# Patient Record
Sex: Male | Born: 1990 | Race: White | Hispanic: No | Marital: Single | Smoking: Current every day smoker
Health system: Southern US, Community
[De-identification: ages and names within clinical notes are randomized; demographics above are authoritative.]

## PROBLEM LIST (undated history)

## (undated) DIAGNOSIS — F909 Attention-deficit hyperactivity disorder, unspecified type: Secondary | ICD-10-CM

---
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
## 2011-03-19 ENCOUNTER — Inpatient Hospital Stay (INDEPENDENT_AMBULATORY_CARE_PROVIDER_SITE_OTHER)
Admission: RE | Admit: 2011-03-19 | Discharge: 2011-03-19 | Disposition: A | Discharge status: Home / Self Care | Payer: Medicaid Other | Source: Ambulatory Visit | Attending: Family Medicine | Admitting: Family Medicine

## 2011-03-19 ENCOUNTER — Ambulatory Visit (INDEPENDENT_AMBULATORY_CARE_PROVIDER_SITE_OTHER): Payer: Medicaid Other

## 2011-03-19 DIAGNOSIS — S60229A Contusion of unspecified hand, initial encounter: Secondary | ICD-10-CM

## 2012-01-28 ENCOUNTER — Emergency Department (INDEPENDENT_AMBULATORY_CARE_PROVIDER_SITE_OTHER)
Admission: EM | Admit: 2012-01-28 | Discharge: 2012-01-28 | Disposition: A | Discharge status: Home / Self Care | Payer: Self-pay | Source: Home / Self Care | Attending: Emergency Medicine | Admitting: Emergency Medicine

## 2012-01-28 ENCOUNTER — Emergency Department (INDEPENDENT_AMBULATORY_CARE_PROVIDER_SITE_OTHER): Payer: Medicaid Other

## 2012-01-28 ENCOUNTER — Encounter (HOSPITAL_COMMUNITY): Payer: Self-pay | Admitting: Emergency Medicine

## 2012-01-28 DIAGNOSIS — J45909 Unspecified asthma, uncomplicated: Secondary | ICD-10-CM

## 2012-01-28 DIAGNOSIS — R0989 Other specified symptoms and signs involving the circulatory and respiratory systems: Secondary | ICD-10-CM

## 2012-01-28 DIAGNOSIS — F411 Generalized anxiety disorder: Secondary | ICD-10-CM

## 2012-01-28 DIAGNOSIS — F458 Other somatoform disorders: Secondary | ICD-10-CM

## 2012-01-28 DIAGNOSIS — F419 Anxiety disorder, unspecified: Secondary | ICD-10-CM

## 2012-01-28 DIAGNOSIS — Z720 Tobacco use: Secondary | ICD-10-CM

## 2012-01-28 HISTORY — DX: Attention-deficit hyperactivity disorder, unspecified type: F90.9

## 2012-01-28 LAB — POCT I-STAT, CHEM 8
Chloride: 102 mEq/L (ref 96–112)
Glucose, Bld: 97 mg/dL (ref 70–99)
HCT: 48 % (ref 39.0–52.0)
Potassium: 3.4 mEq/L — ABNORMAL LOW (ref 3.5–5.1)

## 2012-01-28 MED ORDER — OMEPRAZOLE 20 MG PO CPDR: 20.0000 mg | DELAYED_RELEASE_CAPSULE | Freq: Two times a day (BID) | ORAL | Status: DC

## 2012-01-28 MED ORDER — BUSPIRONE HCL 15 MG PO TABS: 7.5000 mg | ORAL_TABLET | Freq: Three times a day (TID) | ORAL | Status: AC

## 2012-01-28 MED ORDER — ALBUTEROL SULFATE HFA 108 (90 BASE) MCG/ACT IN AERS: 1.0000 | INHALATION_SPRAY | Freq: Four times a day (QID) | RESPIRATORY_TRACT | Status: DC | PRN

## 2012-01-28 NOTE — ED Provider Notes (Signed)
Chief Complaint  Patient presents with  . Shortness of Breath    History of Present Illness:   Jake Mcdonald is a 21 year old male who comes in today with a number of problems including a sensation of a lump in his throat, difficulty breathing, fatigue, cough, and chest pain. The past 2 weeks he notes a sensation of a lump in his throat. He feels like something stuck in his throat. He denies any dysphagia, but notes a decreased appetite due to stress brought on by recent death of his father, difficulty finding a job, and a difficult living situation. He has had some heartburn and indigestion. He denies any pain on swallowing or odynophagia. Food has not gotten stuck in his throat and these had no nausea or vomiting. He also has trouble breathing. He feels like he has trouble getting air in and out. He feels short of breath and sometimes has wheezing. He has a history of asthma in the past and has used an inhaler. His shortness of breath occurs while he is at rest and is not any worse with exertion. He denies any nighttime symptoms. He smokes about half pack of cigarettes per day. He also uses chewing tobacco as well. He feels weak, tired, rundown, and doesn't have much energy. He also feels chilled and sometimes he feels shaky and nervous. He has not had a fever or sore throat. He does have a cough productive of small amount of yellow sputum. He also notes a two-day history of chest pain in the subxiphoid area which radiates through to the back. This tends to be brought on by stress and comes and goes. It's not related to position, exertion, activity, or meals. He denies any associated diaphoresis although he does have nausea, not necessarily associated with the chest pain. He feels nervous and anxious. He denies any depression or suicidal ideation. He has a history of sciatica for the past 4-5 years. He also has a history of ADD but is not taking any medication for that right now.   Review of Systems:  Other than  noted above, the patient denies any of the following symptoms. Systemic:  No fever, chills, sweats, or fatigue. ENT:  No nasal congestion, rhinorrhea, or sore throat. Pulmonary:  No cough, wheezing, shortness of breath, sputum production, hemoptysis. Cardiac:  No palpitations, rapid heartbeat, dizziness, presyncope or syncope. GI:  No abdominal pain, heartburn, nausea, or vomiting. Skin:  No rash or itching. Ext:  No leg pain or swelling.   PMFSH:  Past medical history, family history, social history, meds, and allergies were reviewed and updated as needed.  Physical Exam:   Vital signs:  BP 120/76  Pulse 53  Temp 99.2 F (37.3 C) (Oral)  Resp 18  SpO2 100% Gen:  Alert, oriented, in no distress, skin warm and dry. Eye:  PERRL, lids and conjunctivas normal.  Sclera non-icteric. ENT:  Mucous membranes moist, pharynx clear. Neck:  Supple, no adenopathy or mass.  No JVD.he did have some tenderness to palpation just above the sternal notch.  Lungs:  Clear to auscultation, no wheezes, rales or rhonchi.  No respiratory distress. Heart:  Regular rhythm.  No gallops, murmers, clicks or rubs. Chest:  No chest wall tenderness. Abdomen:  Soft,  Slight epigastric tenderness to palpation without guarding or rebound , no organomegaly or mass.  Bowel sounds normal.  No pulsatile abdominal mass or bruit. Ext:  No edema.  No calf tenderness and Homann's sign negative.  Pulses full and equal. Skin:  Warm and dry.  No rash.    Labs:   Results for orders placed during the hospital encounter of 01/28/12  POCT I-STAT, CHEM 8      Component Value Range   Sodium 142  135 - 145 mEq/L   Potassium 3.4 (*) 3.5 - 5.1 mEq/L   Chloride 102  96 - 112 mEq/L   BUN 7  6 - 23 mg/dL   Creatinine, Ser 4.09  0.50 - 1.35 mg/dL   Glucose, Bld 97  70 - 99 mg/dL   Calcium, Ion 8.11  9.14 - 1.23 mmol/L   TCO2 25  0 - 100 mmol/L   Hemoglobin 16.3  13.0 - 17.0 g/dL   HCT 78.2  95.6 - 21.3 %     Radiology:  Dg Chest  2 View  01/28/2012  *RADIOLOGY REPORT*  Clinical Data: Chest pain  CHEST - 2 VIEW  Comparison: None.  Findings: There is no focal infiltrate, pulmonary edema, or pleural effusion.  The mediastinal contour and cardiac silhouette are normal.  There is scoliosis of spine.  IMPRESSION: No acute cardiopulmonary disease identified.  No   Original Report Authenticated By: Sherian Rein, M.D.     EKG:   Date: 01/28/2012  Rate: 56   Rhythm: sinus bradycardia  QRS Axis: normal  Intervals: normal  ST/T Wave abnormalities: normal  Conduction Disutrbances:none  Narrative Interpretation: Sinus bradycardia with marked sinus arrhythmia, otherwise normal EKG.  Old EKG Reviewed: none available  Assessment:  The primary encounter diagnosis was Reflux esophagitis. Diagnoses of Globus sensation, Asthma, Anxiety, and Tobacco abuse were also pertinent to this visit.   I think that most of his symptoms are due to anxiety with a large component of reflux and tobacco abuse as well. I discussed with him the importance of finding a primary care physician, although he has no insurance right now and is not on Medicaid. I also spent a good deal of time discussing tobacco cessation with him. I offered to give him a prescription for Nicoderm patches, but he states he cannot afford this and will try to cut down for right now. I told him once he got medical insurance he should make a concerted effort to quit.  Plan:   1.  The following meds were prescribed:   New Prescriptions   ALBUTEROL (PROVENTIL HFA;VENTOLIN HFA) 108 (90 BASE) MCG/ACT INHALER    Inhale 1-2 puffs into the lungs every 6 (six) hours as needed for wheezing.   BUSPIRONE (BUSPAR) 15 MG TABLET    Take 0.5 tablets (7.5 mg total) by mouth 3 (three) times daily.   OMEPRAZOLE (PRILOSEC) 20 MG CAPSULE    Take 1 capsule (20 mg total) by mouth 2 (two) times daily before a meal.   2.  The patient was instructed in symptomatic care and handouts were given. 3.  The  patient was told to return if becoming worse in any way, if no better in 3 or 4 days, and given some red flag symptoms that would indicate earlier return.    Reuben Likes, MD 01/28/12 [phone]

## 2012-01-28 NOTE — ED Notes (Addendum)
C/o sob for 2 weeks, weakness, no fever, no sore throat.  Also c/o chest pain, touching center, epigastric area

## 2013-01-01 IMAGING — CR DG HAND COMPLETE 3+V*R*
3 series · 3 of 3 positions shown · non-contrast
Comparison: None.

CLINICAL DATA: Punching injury to the right hand.

RIGHT HAND - COMPLETE 3+ VIEW 03/19/2011:

[view not recorded (1 of 3)]
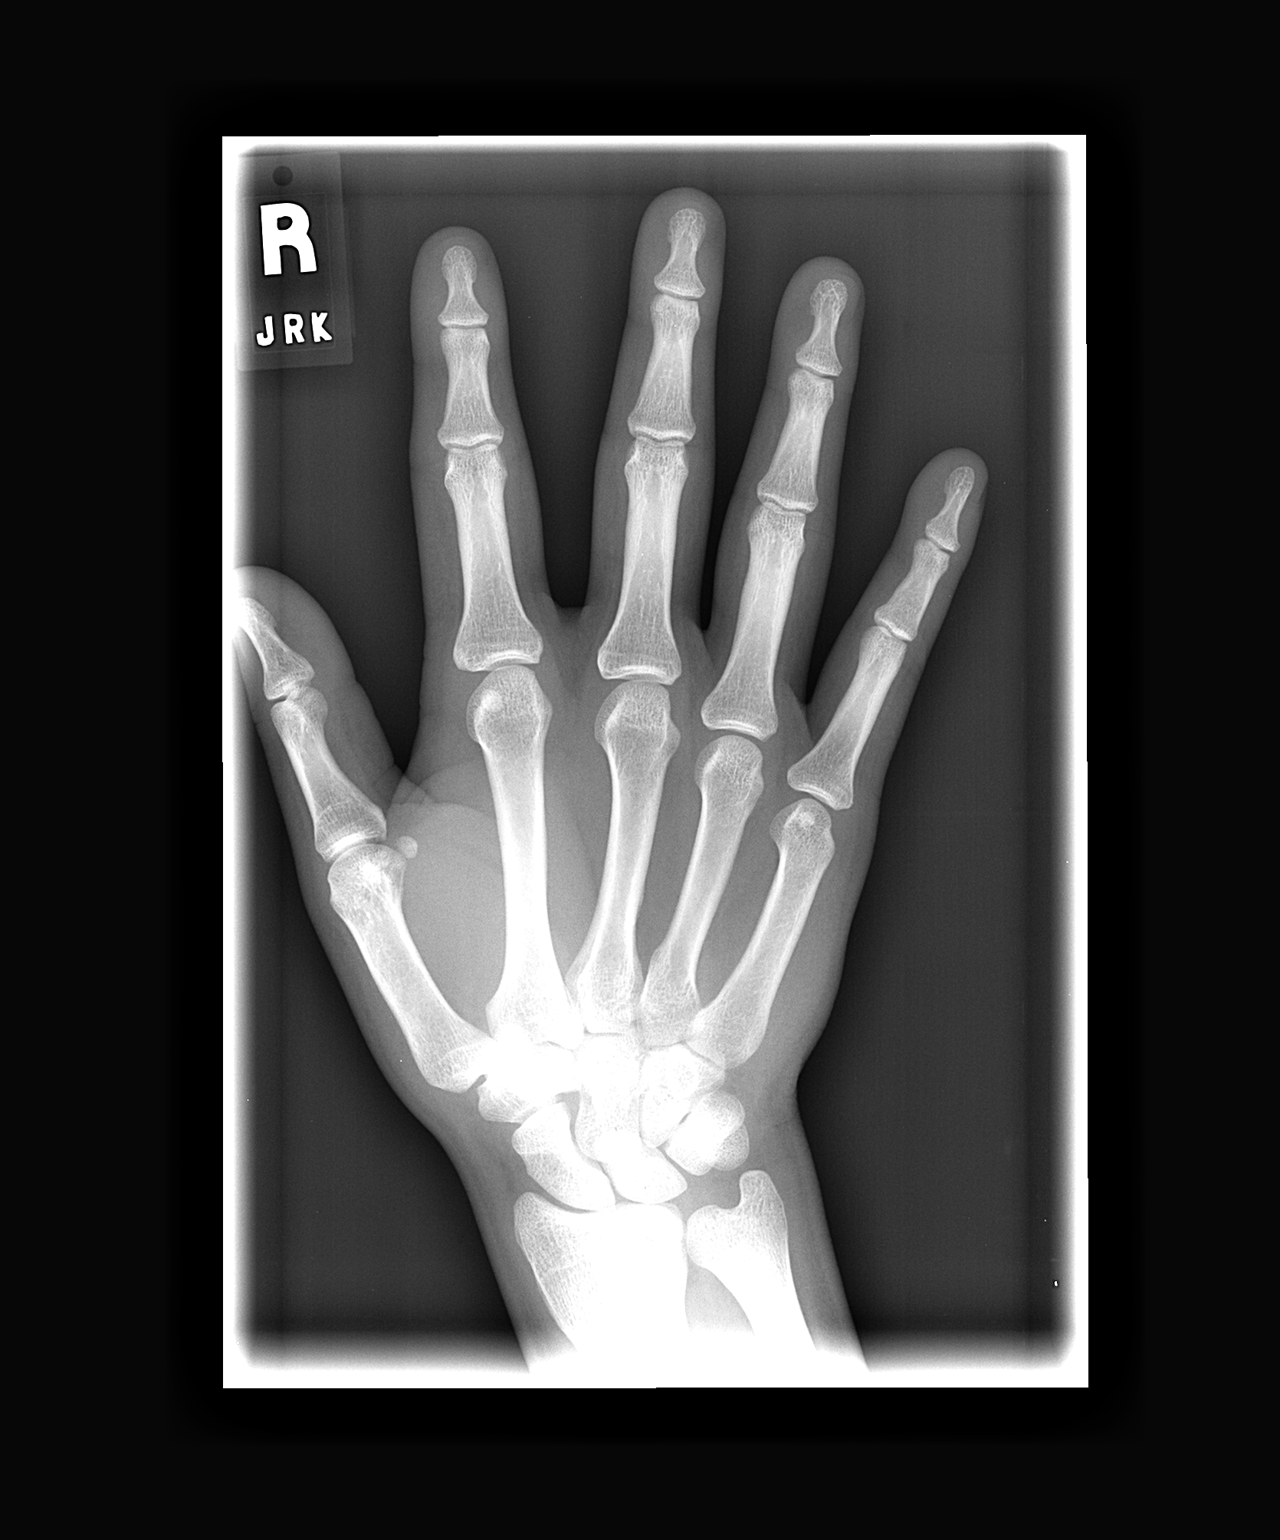

[view not recorded (2 of 3)]
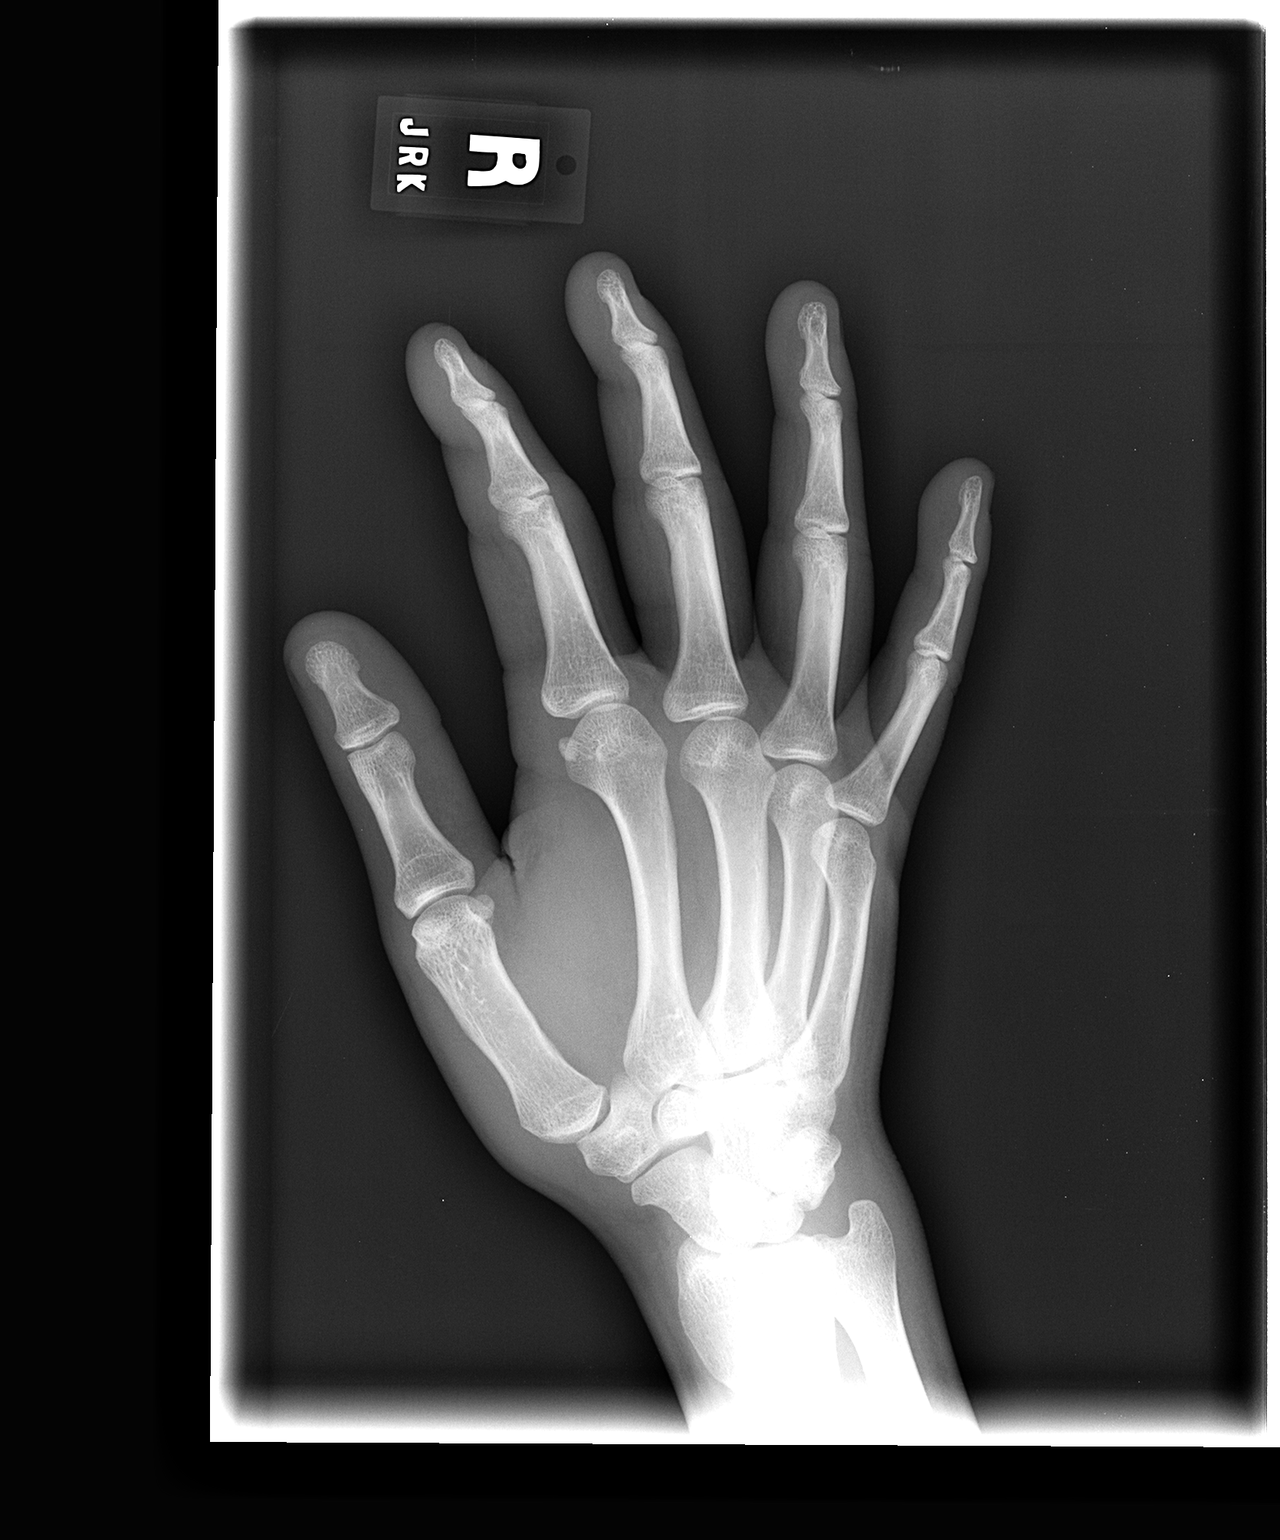

[view not recorded (3 of 3)]
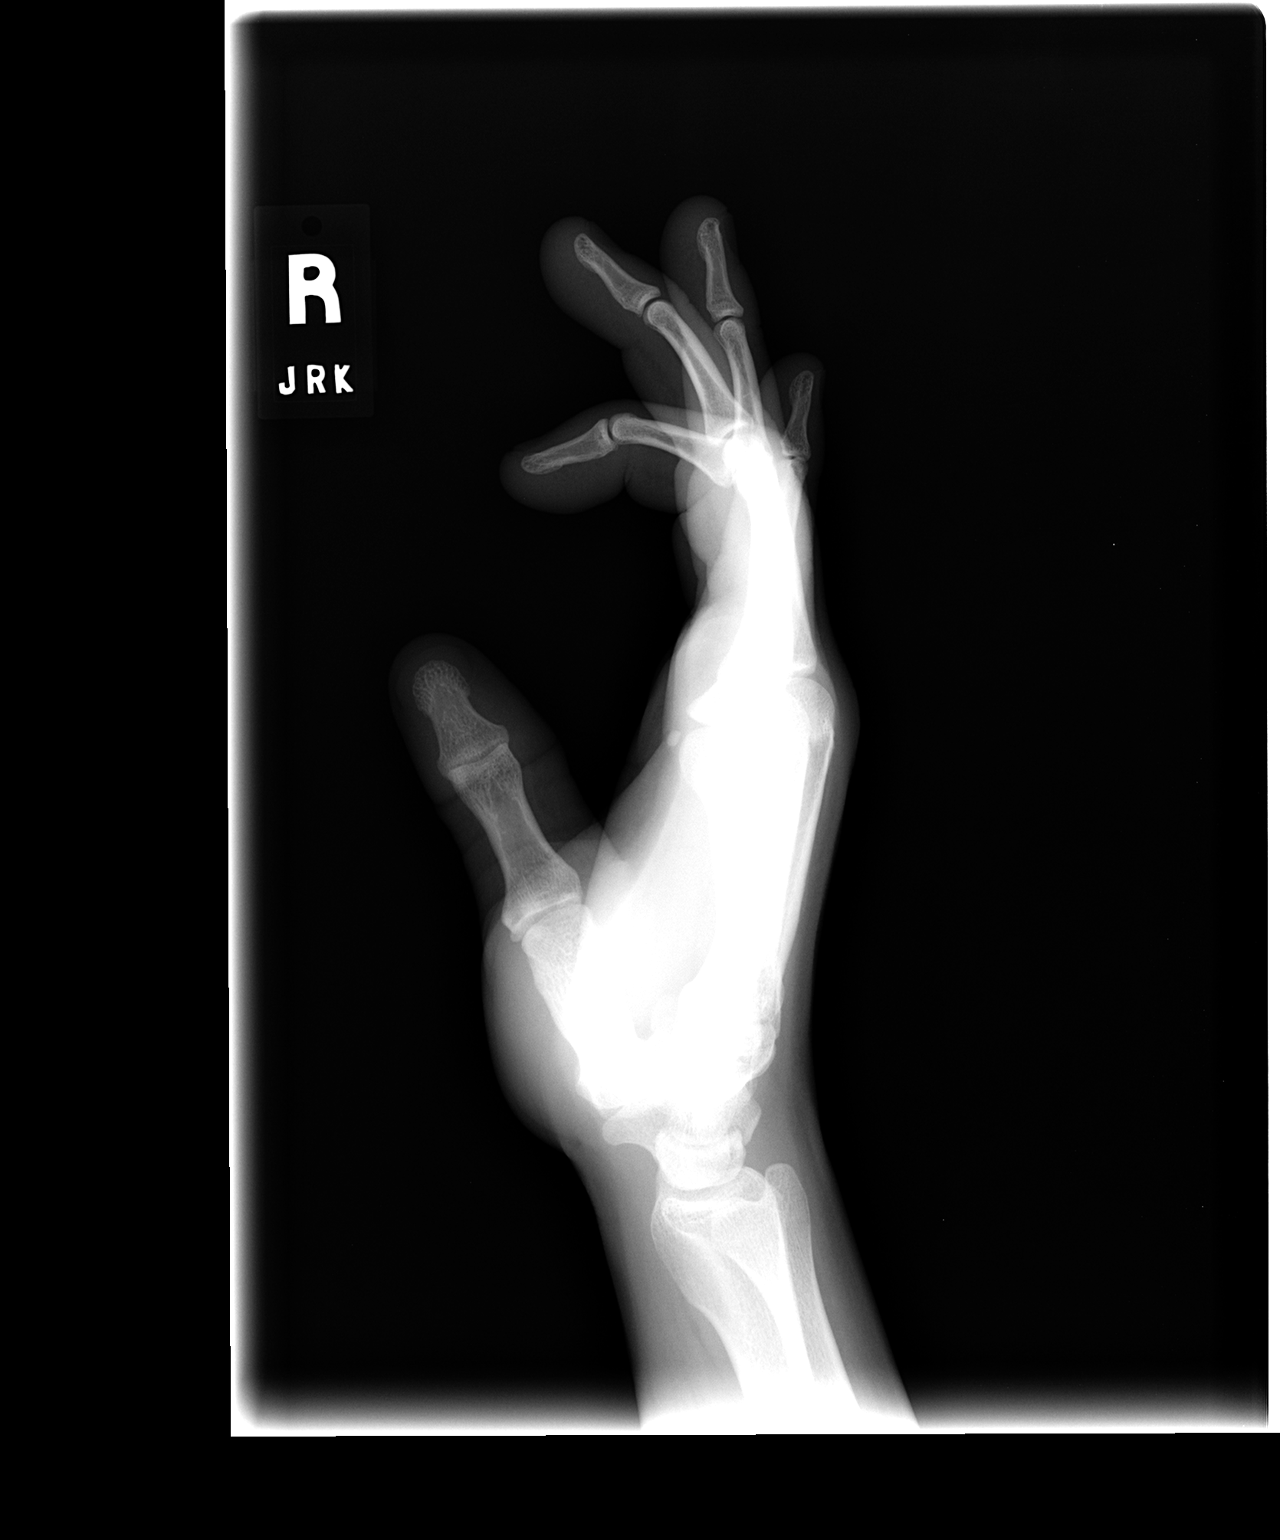

[3 of 3 positions shown; findings below may reference images not displayed]

FINDINGS: No evidence of acute or subacute fracture or dislocation.
Well-preserved joint spaces.  Well-preserved bone mineral density.
No intrinsic osseous abnormalities.
IMPRESSION: Normal examination.

## 2013-02-10 ENCOUNTER — Encounter (HOSPITAL_COMMUNITY): Payer: Self-pay | Admitting: Emergency Medicine

## 2013-02-10 ENCOUNTER — Emergency Department (HOSPITAL_COMMUNITY): Payer: Medicaid Other

## 2013-02-10 ENCOUNTER — Emergency Department (HOSPITAL_COMMUNITY)
Admission: EM | Admit: 2013-02-10 | Discharge: 2013-02-10 | Disposition: A | Discharge status: Home / Self Care | Payer: Medicaid Other | Attending: Emergency Medicine | Admitting: Emergency Medicine

## 2013-02-10 DIAGNOSIS — Y939 Activity, unspecified: Secondary | ICD-10-CM | POA: Insufficient documentation

## 2013-02-10 DIAGNOSIS — Z8659 Personal history of other mental and behavioral disorders: Secondary | ICD-10-CM | POA: Insufficient documentation

## 2013-02-10 DIAGNOSIS — F17200 Nicotine dependence, unspecified, uncomplicated: Secondary | ICD-10-CM | POA: Insufficient documentation

## 2013-02-10 DIAGNOSIS — X58XXXA Exposure to other specified factors, initial encounter: Secondary | ICD-10-CM | POA: Insufficient documentation

## 2013-02-10 DIAGNOSIS — S91309A Unspecified open wound, unspecified foot, initial encounter: Secondary | ICD-10-CM | POA: Insufficient documentation

## 2013-02-10 DIAGNOSIS — Y929 Unspecified place or not applicable: Secondary | ICD-10-CM | POA: Insufficient documentation

## 2013-02-10 DIAGNOSIS — S91331A Puncture wound without foreign body, right foot, initial encounter: Secondary | ICD-10-CM

## 2013-02-10 MED ORDER — HYDROCODONE-ACETAMINOPHEN 5-325 MG PO TABS
1.0000 | ORAL_TABLET | Freq: Once | ORAL | Status: AC
  Administered 2013-02-10: 1 via ORAL
  Filled 2013-02-10: qty 1

## 2013-02-10 MED ORDER — CEPHALEXIN 500 MG PO CAPS: 500.0000 mg | ORAL_CAPSULE | Freq: Four times a day (QID) | ORAL | Status: DC

## 2013-02-10 MED ORDER — HYDROCODONE-ACETAMINOPHEN 5-325 MG PO TABS: 1.0000 | ORAL_TABLET | ORAL | Status: DC | PRN

## 2013-02-10 NOTE — ED Provider Notes (Signed)
Medical screening examination/treatment/procedure(s) were performed by non-physician practitioner and as supervising physician I was immediately available for consultation/collaboration.   William Marialuiza Car, MD 02/10/13 1539 

## 2013-02-10 NOTE — ED Provider Notes (Signed)
CSN: [id number]     Arrival date & time 02/10/13  1116 History   First MD Initiated Contact with Patient 02/10/13 1233     Chief Complaint  Patient presents with  . Foot Pain   (Consider location/radiation/quality/duration/timing/severity/associated sxs/prior Treatment) Patient is a 22 y.o. male presenting with lower extremity pain. The history is provided by the patient. No language interpreter was used.  Foot Pain This is a new problem. The current episode started yesterday. Pertinent negatives include no chills, fever or numbness. Associated symptoms comments: Right foot pain after stepping on unknown object last night at arch of foot. .    Past Medical History  Diagnosis Date  . ADHD (attention deficit hyperactivity disorder)    History reviewed. No pertinent past surgical history. No family history on file. History  Substance Use Topics  . Smoking status: Current Every Day Smoker  . Smokeless tobacco: Not on file  . Alcohol Use: Yes    Review of Systems  Constitutional: Negative for fever and chills.  Musculoskeletal:       See HPI  Skin: Positive for wound.  Neurological: Negative.  Negative for numbness.    Allergies  Ritalin  Home Medications  No current outpatient prescriptions on file. BP 140/89  Pulse 94  Temp(Src) 98.4 F (36.9 C)  Resp 16  SpO2 98% Physical Exam  Constitutional: He is oriented to person, place, and time. He appears well-developed and well-nourished.  Neck: Normal range of motion.  Pulmonary/Chest: Effort normal.  Musculoskeletal: Normal range of motion.  Puncture wound right foot in center of foot arch. No palpable foreign body. Minimal swelling.   Neurological: He is alert and oriented to person, place, and time.  Skin: Skin is warm and dry.  Psychiatric: He has a normal mood and affect.    ED Course  Procedures (including critical care time) Labs Review Labs Reviewed - No data to display Imaging Review No results found. Dg  Foot Complete Right  02/10/2013   *RADIOLOGY REPORT*  Clinical Data: Pain post trauma  RIGHT FOOT COMPLETE - 3+ VIEW  Comparison: None.  Findings: Frontal, oblique, and lateral views were obtained.  No fracture dislocation.  Joint spaces appear intact.  No erosive change.  No radiopaque foreign body.  No erosive change or bony destruction.  IMPRESSION: No abnormality noted.   Original Report Authenticated By: Bretta Bang, M.D.   MDM  No diagnosis found. 1. Puncture wound, right foot  No FB palpated or visualized on X-ray. Will cover with abx prophyllactically.    Arnoldo Hooker, PA-C 02/10/13 1358

## 2013-02-10 NOTE — ED Notes (Signed)
Rt foot after stepping on something last night about 1 am was barefoot at the time last tetanus shot unkn

## 2013-09-05 ENCOUNTER — Encounter (HOSPITAL_COMMUNITY): Payer: Self-pay | Admitting: Emergency Medicine

## 2013-09-05 ENCOUNTER — Emergency Department (HOSPITAL_COMMUNITY)
Admission: EM | Admit: 2013-09-05 | Discharge: 2013-09-06 | Disposition: A | Discharge status: Home / Self Care | Payer: Self-pay | Attending: Emergency Medicine | Admitting: Emergency Medicine

## 2013-09-05 DIAGNOSIS — F411 Generalized anxiety disorder: Secondary | ICD-10-CM | POA: Insufficient documentation

## 2013-09-05 DIAGNOSIS — M5430 Sciatica, unspecified side: Secondary | ICD-10-CM | POA: Insufficient documentation

## 2013-09-05 DIAGNOSIS — Z792 Long term (current) use of antibiotics: Secondary | ICD-10-CM | POA: Insufficient documentation

## 2013-09-05 DIAGNOSIS — F4321 Adjustment disorder with depressed mood: Secondary | ICD-10-CM

## 2013-09-05 DIAGNOSIS — R443 Hallucinations, unspecified: Secondary | ICD-10-CM | POA: Insufficient documentation

## 2013-09-05 DIAGNOSIS — M549 Dorsalgia, unspecified: Secondary | ICD-10-CM | POA: Insufficient documentation

## 2013-09-05 DIAGNOSIS — R45851 Suicidal ideations: Secondary | ICD-10-CM | POA: Insufficient documentation

## 2013-09-05 DIAGNOSIS — F319 Bipolar disorder, unspecified: Secondary | ICD-10-CM | POA: Insufficient documentation

## 2013-09-05 DIAGNOSIS — F17200 Nicotine dependence, unspecified, uncomplicated: Secondary | ICD-10-CM | POA: Insufficient documentation

## 2013-09-05 LAB — COMPREHENSIVE METABOLIC PANEL
ALT: 24 U/L (ref 0–53)
AST: 20 U/L (ref 0–37)
Albumin: 4.2 g/dL (ref 3.5–5.2)
Alkaline Phosphatase: 122 U/L — ABNORMAL HIGH (ref 39–117)
BILIRUBIN TOTAL: 0.5 mg/dL (ref 0.3–1.2)
BUN: 12 mg/dL (ref 6–23)
CHLORIDE: 99 meq/L (ref 96–112)
CO2: 26 meq/L (ref 19–32)
CREATININE: 0.97 mg/dL (ref 0.50–1.35)
Calcium: 9.7 mg/dL (ref 8.4–10.5)
GFR calc Af Amer: >90 mL/min (ref >90)
GLUCOSE: 106 mg/dL — AB (ref 70–99)
Potassium: 3.6 mEq/L — ABNORMAL LOW (ref 3.7–5.3)
Sodium: 138 mEq/L (ref 137–147)
Total Protein: 8.2 g/dL (ref 6.0–8.3)

## 2013-09-05 LAB — RAPID URINE DRUG SCREEN, HOSP PERFORMED
Amphetamines: NOT DETECTED
BARBITURATES: NOT DETECTED
Benzodiazepines: NOT DETECTED
Cocaine: NOT DETECTED
Opiates: NOT DETECTED
Tetrahydrocannabinol: POSITIVE — AB

## 2013-09-05 LAB — CBC
HCT: 45.5 % (ref 39.0–52.0)
HEMOGLOBIN: 15.6 g/dL (ref 13.0–17.0)
MCH: 27.6 pg (ref 26.0–34.0)
MCHC: 34.3 g/dL (ref 30.0–36.0)
MCV: 80.5 fL (ref 78.0–100.0)
PLATELETS: 247 10*3/uL (ref 150–400)
RBC: 5.65 MIL/uL (ref 4.22–5.81)
RDW: 11.9 % (ref 11.5–15.5)
WBC: 9.1 10*3/uL (ref 4.0–10.5)

## 2013-09-05 LAB — ETHANOL: Alcohol, Ethyl (B): <11 mg/dL (ref 0–11)

## 2013-09-05 MED ORDER — ALUM & MAG HYDROXIDE-SIMETH 200-200-20 MG/5ML PO SUSP: 30.0000 mL | ORAL | Status: DC | PRN

## 2013-09-05 MED ORDER — IBUPROFEN 200 MG PO TABS: 600.0000 mg | ORAL_TABLET | Freq: Three times a day (TID) | ORAL | Status: DC | PRN

## 2013-09-05 MED ORDER — ACETAMINOPHEN 325 MG PO TABS: 650.0000 mg | ORAL_TABLET | ORAL | Status: DC | PRN

## 2013-09-05 MED ORDER — NICOTINE 21 MG/24HR TD PT24
21.0000 mg | MEDICATED_PATCH | Freq: Every day | TRANSDERMAL | Status: DC
  Administered 2013-09-05: 21 mg via TRANSDERMAL
  Filled 2013-09-05: qty 1

## 2013-09-05 MED ORDER — ONDANSETRON HCL 4 MG PO TABS: 4.0000 mg | ORAL_TABLET | Freq: Three times a day (TID) | ORAL | Status: DC | PRN

## 2013-09-05 MED ORDER — LORAZEPAM 1 MG PO TABS
1.0000 mg | ORAL_TABLET | Freq: Three times a day (TID) | ORAL | Status: DC | PRN
Start: 2013-09-05 — End: 2013-09-06

## 2013-09-05 NOTE — ED Notes (Signed)
Pt alert, calm and cooperative. Pt denies SI/HI, denies A/V hall and contracst for safety in/out of hospital. Pt states he has a rough time because he lost his father 11/2011 from a heart attack, his mother 12/13 from a heart attack and just lost his grandmother 12/14. "My sister kicked me out last week", "Staying with friends who may be kicking me out too", fired from [employer] one month ago and is now unemployed. Will continue to monitor closely and maintain safe environment.

## 2013-09-05 NOTE — ED Provider Notes (Signed)
CSN: [id number]     Arrival date & time 09/05/13  1413 History  This chart was scribed for non-physician practitioner Renne Crigler, PA-C,  working with Flint Melter, MD, by Yevette Edwards, ED Scribe. This patient was seen in room WTR4/WLPT4 and the patient's care was started at 3:33 PM.  None    Chief Complaint  Patient presents with  . Medical Clearance    The history is provided by the patient, the police and medical records. No language interpreter was used.   HPI Comments: Jake Mcdonald is a 23 y.o. male, with a h/o bipolar,  who presents to the Emergency Department due to IVC papers. The pt was brought in by the police. He reports SI, but he states he was drunk when he reported the SI. He had a few beers yesterday evening, and he reports he drank heavily a few nigths before. He denies abuse of medication or other drugs. He denies use of current medication. He also denies difficulty sleeping. He reports he has been "very stressed and feels like I am losing my family." Per IVC, patient has been having auditory hallucinations (demons talking to him). He states this has been a hard time. The pt also complains of sciatica to his back and a suspected abscess to his buttocks to which he states improvement.   Past Medical History  Diagnosis Date  . ADHD (attention deficit hyperactivity disorder)    No past surgical history on file. No family history on file. History  Substance Use Topics  . Smoking status: Current Every Day Smoker  . Smokeless tobacco: Not on file  . Alcohol Use: Yes    Review of Systems  Constitutional: Negative for fever.  HENT: Negative for rhinorrhea and sore throat.   Eyes: Negative for redness.  Respiratory: Negative for cough.   Cardiovascular: Negative for chest pain.  Gastrointestinal: Negative for nausea, vomiting, abdominal pain and diarrhea.  Genitourinary: Negative for dysuria.  Musculoskeletal: Positive for back pain. Negative for myalgias.   Skin: Negative for rash.  Neurological: Negative for headaches.  Psychiatric/Behavioral: Positive for suicidal ideas (Reports he was drunk when he reported SI. ). Negative for sleep disturbance. The patient is nervous/anxious.     Allergies  Ritalin  Home Medications   Current Outpatient Rx  Name  Route  Sig  Dispense  Refill  . cephALEXin (KEFLEX) 500 MG capsule   Oral   Take 1 capsule (500 mg total) by mouth 4 (four) times daily.   20 capsule   0   . HYDROcodone-acetaminophen (NORCO/VICODIN) 5-325 MG per tablet   Oral   Take 1 tablet by mouth every 4 (four) hours as needed for pain.   10 tablet   0    Triage Vitals: BP 130/83  Pulse 60  Temp(Src) 98.8 F (37.1 C) (Oral)  Resp 16  SpO2 100%  Physical Exam  Nursing note and vitals reviewed. Constitutional: He appears well-developed and well-nourished. No distress.  HENT:  Head: Normocephalic and atraumatic.  Eyes: Conjunctivae and EOM are normal. Right eye exhibits no discharge. Left eye exhibits no discharge.  Neck: Normal range of motion. Neck supple. No tracheal deviation present.  Cardiovascular: Normal rate, regular rhythm and normal heart sounds.   No murmur heard. Pulmonary/Chest: Effort normal and breath sounds normal. No respiratory distress. He has no wheezes. He has no rales.  Abdominal: Soft. There is no tenderness.  Musculoskeletal: Normal range of motion.  Neurological: He is alert.  Skin: Skin is warm  and dry.  Psychiatric: He has a normal mood and affect. He is actively hallucinating. He expresses suicidal ideation. He expresses no homicidal ideation.    ED Course  Procedures (including critical care time)  DIAGNOSTIC STUDIES: Oxygen Saturation is 100% on room air, normal by my interpretation.    COORDINATION OF CARE:  3:37 PM- Discussed treatment plan with patient, and the patient agreed to the plan.   Labs Review Labs Reviewed  COMPREHENSIVE METABOLIC PANEL - Abnormal; Notable for the  following:    Potassium 3.6 (*)    Glucose, Bld 106 (*)    Alkaline Phosphatase 122 (*)    All other components within normal limits  CBC  ETHANOL  URINE RAPID DRUG SCREEN (HOSP PERFORMED)   Imaging Review No results found.   EKG Interpretation None      Patient seen and examined. Work-up initiated. Medications ordered. Holding orders complete. Patient is medically cleared.   Vital signs reviewed and are as follows: Filed Vitals:   09/05/13 1423  BP: 130/83  Pulse: 60  Temp: 98.8 F (37.1 C)  Resp: 16    MDM   Final diagnoses:  Suicidal ideation  Bipolar 1 disorder   Patient under IVC, pending TTS consult, placement decision.   I personally performed the services described in this documentation, which was scribed in my presence. The recorded information has been reviewed and is accurate.    Renne CriglerJoshua Gennett Garcia, PA-C 09/05/13 1725

## 2013-09-05 NOTE — ED Notes (Signed)
Pt BIB police.  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  Respondent has a hx of SI and has attempted suicide.  Further, family relates that respondent has stated that he does not need help that the "demons inside of him tell him what to do and that he wishes he would have been successful in his prior suicide attempt.  Also respondent has stated that he intends to kill a specific person that he has not identified to his family.  Family also relates that respondent abuses alcohol and drugs on a regular basis.  Family is concerned for his safety and that of the unknown individual who he intends to harm.    Pt denies most all allegations.  Pt states that he will "stick up for himself if his brother-in-law's dad comes at him" which is the person to whom he is referring to harm.  Denies SI.  Denies HI.

## 2013-09-05 NOTE — BH Assessment (Signed)
Consulted with EDP Dr. Effie ShyWentz to obtain clinicals prior to assessing patient.  Glorious PeachNajah Liyat Faulkenberry, MS, LCASA Assessment Counselor

## 2013-09-05 NOTE — BH Assessment (Signed)
TTS assessment complete.  Duane Trias, MS, LCASA Assessment Counselor  

## 2013-09-05 NOTE — ED Provider Notes (Signed)
Medical screening examination/treatment/procedure(s) were performed by non-physician practitioner and as supervising physician I was immediately available for consultation/collaboration.  Shila Kruczek L Karcyn Menn, MD 09/05/13 2331 

## 2013-09-05 NOTE — ED Notes (Signed)
Pt talking on the phone to his mom and cussing at her.

## 2013-09-06 DIAGNOSIS — F4321 Adjustment disorder with depressed mood: Secondary | ICD-10-CM

## 2013-09-06 NOTE — BHH Suicide Risk Assessment (Signed)
Suicide Risk Assessment  Discharge Assessment     Demographic Factors:  Male, Adolescent or young adult and Caucasian  Total Time spent with patient: 45 minutes  Psychiatric Specialty Exam:     Blood pressure 115/69, pulse 62, temperature 98 F (36.7 C), temperature source Oral, resp. rate 18, SpO2 99.00%.There is no height or weight on file to calculate BMI.  General Appearance: Fairly Groomed  Patent attorneyye Contact::  Good  Speech:  Clear and Coherent  Volume:  Normal  Mood:  Euthymic  Affect:  Appropriate  Thought Process:  Coherent and Logical  Orientation:  Full (Time, Place, and Person)  Thought Content:  Negative  Suicidal Thoughts:  No  Homicidal Thoughts:  No  Memory:  Immediate;   Good Recent;   Good Remote;   Good  Judgement:  Intact  Insight:  Good  Psychomotor Activity:  Normal  Concentration:  Good  Recall:  Good  Fund of Knowledge:Good  Language: Good  Akathisia:  Negative  Handed:  Right  AIMS (if indicated):     Assets:  Communication Skills Desire for Improvement Housing Leisure Time Physical Health Talents/Skills  Sleep:       Musculoskeletal: Strength & Muscle Tone: within normal limits Gait & Station: normal Patient leans: N/A   Mental Status Per Nursing Assessment::   On Admission:     Current Mental Status by Physician: NA  Loss Factors: NA  Historical Factors: NA  Risk Reduction Factors:   Living with another person, especially a relative  Continued Clinical Symptoms:  Dysthymia  Cognitive Features That Contribute To Risk:  Closed-mindedness    Suicide Risk:  Minimal: No identifiable suicidal ideation.  Patients presenting with no risk factors but with morbid ruminations; may be classified as minimal risk based on the severity of the depressive symptoms  Discharge Diagnoses:   AXIS I:  Adjustment Disorder with Depressed Mood AXIS II:  Deferred AXIS III:   Past Medical History  Diagnosis Date  . ADHD (attention deficit  hyperactivity disorder)    AXIS IV:  economic problems, housing problems, occupational problems and problems related to social environment AXIS V:  61-70 mild symptoms  Plan Of Care/Follow-up recommendations:  Activity:  resume usual activity Diet:  resume usual diet  Is patient on multiple antipsychotic therapies at discharge:  No   Has Patient had three or more failed trials of antipsychotic monotherapy by history:  No  Recommended Plan for Multiple Antipsychotic Therapies: NA    TAYLOR,GERALD D 09/06/2013, 11:09 AM

## 2013-09-06 NOTE — BHH Counselor (Addendum)
Pt's IVC rescinded by Dr. Ladona Ridgelaylor and [facility]. Original placed in medical records bin. Pt was given info re: [facility]. Pt given written resources including the following:   [facility] ACCESS LINE:  [phone] or [phone] [facility] [address] [place], [place] [address]  Psych[facility]rists [facility]   [facility] [address]    [address] [place], [place] [address]    [place], [place] [address] [phone]      [phone]  Dr. Archer AsaGerald Plovsky     [facility] [address]    [address] [place] [address]    [place] [address] [phone]      [phone]  [facility]ists [facility]    [facility] [address]   [address] [place]     [place], [place] [phone]      [phone]  [facility] [address]     [address]  [place] [address]    [place], [place] [phone]      [phone]   Mobile Crisis Teams [facility]    Assertive  Mobile Crisis Care Unit    Psychotherapeutic Services [phone]     [address], [place], [place]        [phone]  Pain Management [facility]  [facility] [address]     [address] [place], [place] [address]      [place]  [phone]        [phone]   Pain Management      [facility] [address]     [address] [place], [place] [address]      [place] [phone]        [phone]   Therapists [facility]  [facility]  [facility] [address]  [address]   [address] [place] [address]   [place], [place] [address]   [place]  [address] [phone]    [phone]    [phone]  [facility]  [facility] [address]    [address] [place], [place] [address]  [place], [place]  [phone]    [phone]  Psychiatrists [facility]  [facility] [address]   [address] [place], [place] [address]   [place], [place] [address] [phone]     [phone]
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
## 2013-09-06 NOTE — Consult Note (Signed)
Review of Systems   Constitutional: Negative.    HENT: Negative.    Eyes: Negative.    Respiratory: Negative.    Cardiovascular: Negative.    Gastrointestinal: Negative.    Genitourinary: Negative.    Musculoskeletal: Positive for back pain.   Skin: Negative.    Neurological: Negative.    Endo/Heme/Allergies: Negative.    Psychiatric/Behavioral: Negative.

## 2013-09-06 NOTE — Progress Notes (Signed)
[facility] did not get to see patient but will be sending information about the [facility] Orange Card program, using the address provided.  °

## 2013-09-06 NOTE — BH Assessment (Signed)
Informed Dr. Wilkie AyeHorton that patient is awaiting psychiatric consult in the morning to determine disposition.  Glorious PeachNajah Branae Crail, MS, LCASA Assessment Counselor
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
## 2013-11-12 IMAGING — CR DG CHEST 2V
2 series · 2 of 2 positions shown · non-contrast
Comparison: None.

CLINICAL DATA: Chest pain

CHEST - 2 VIEW

[view not recorded (1 of 2)]
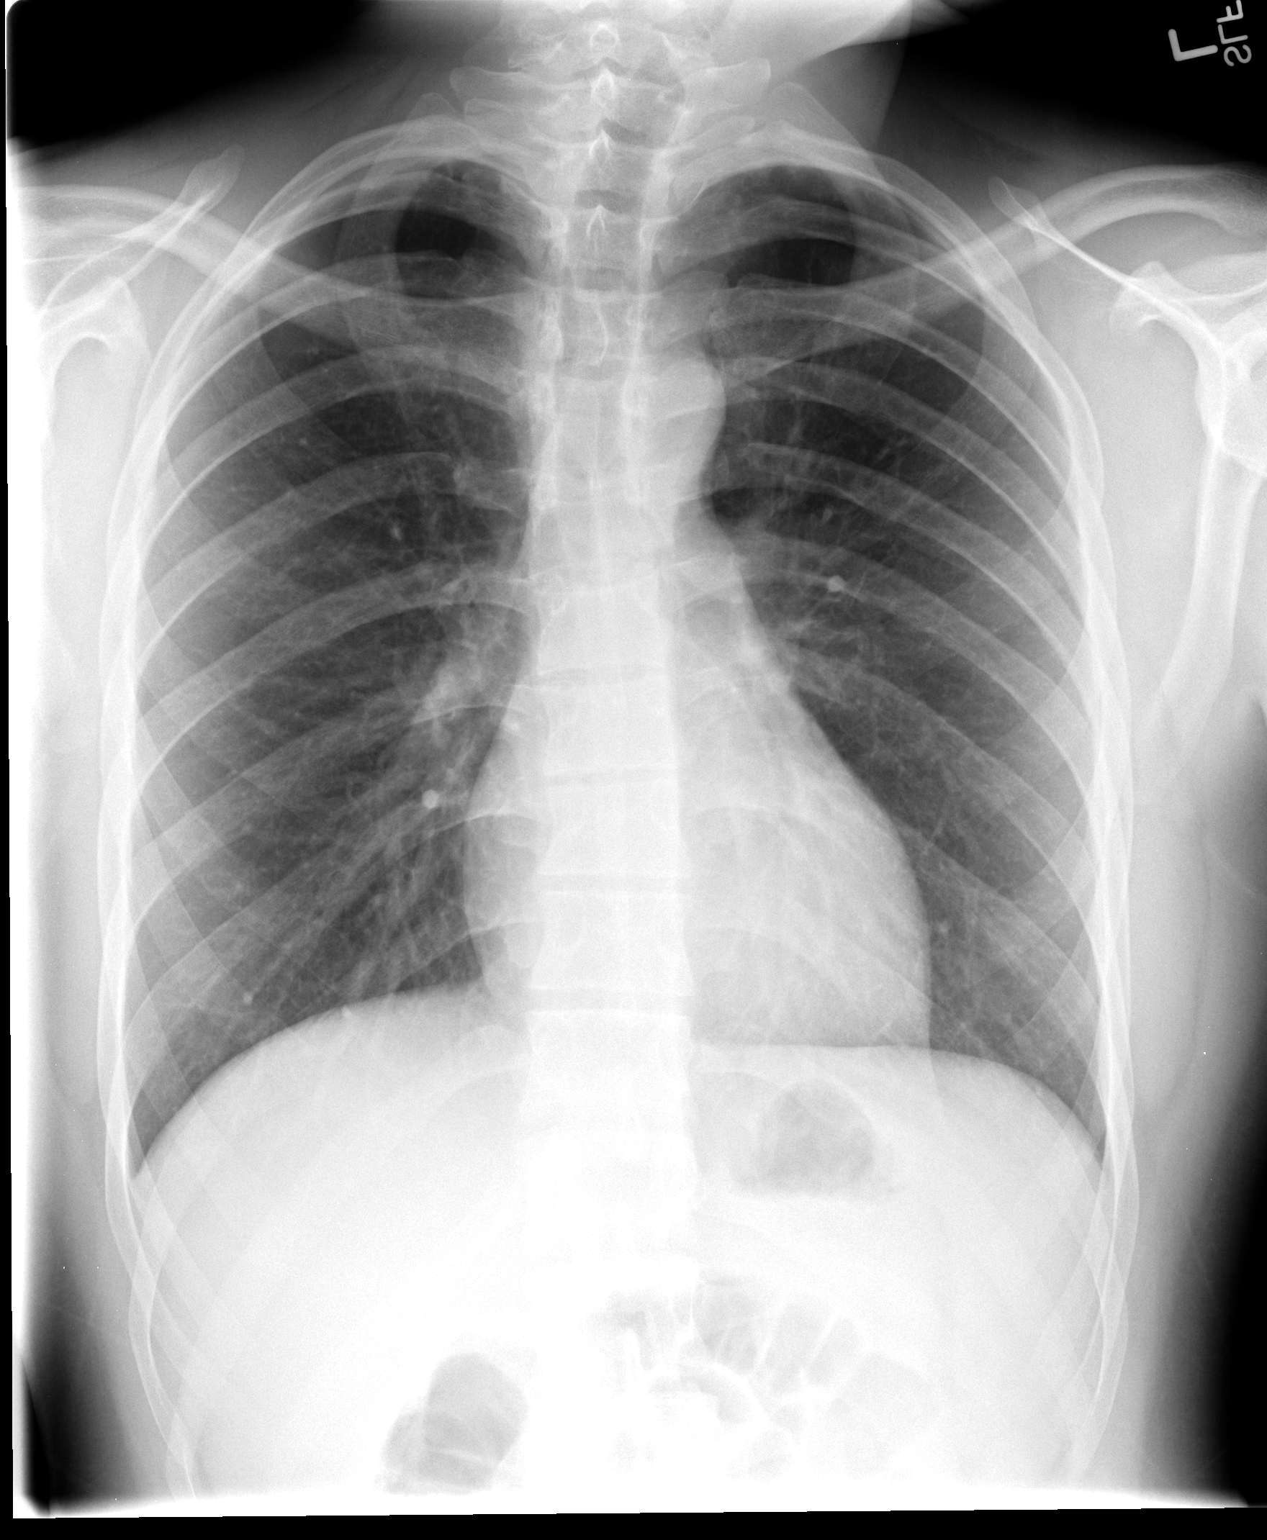

[view not recorded (2 of 2)]
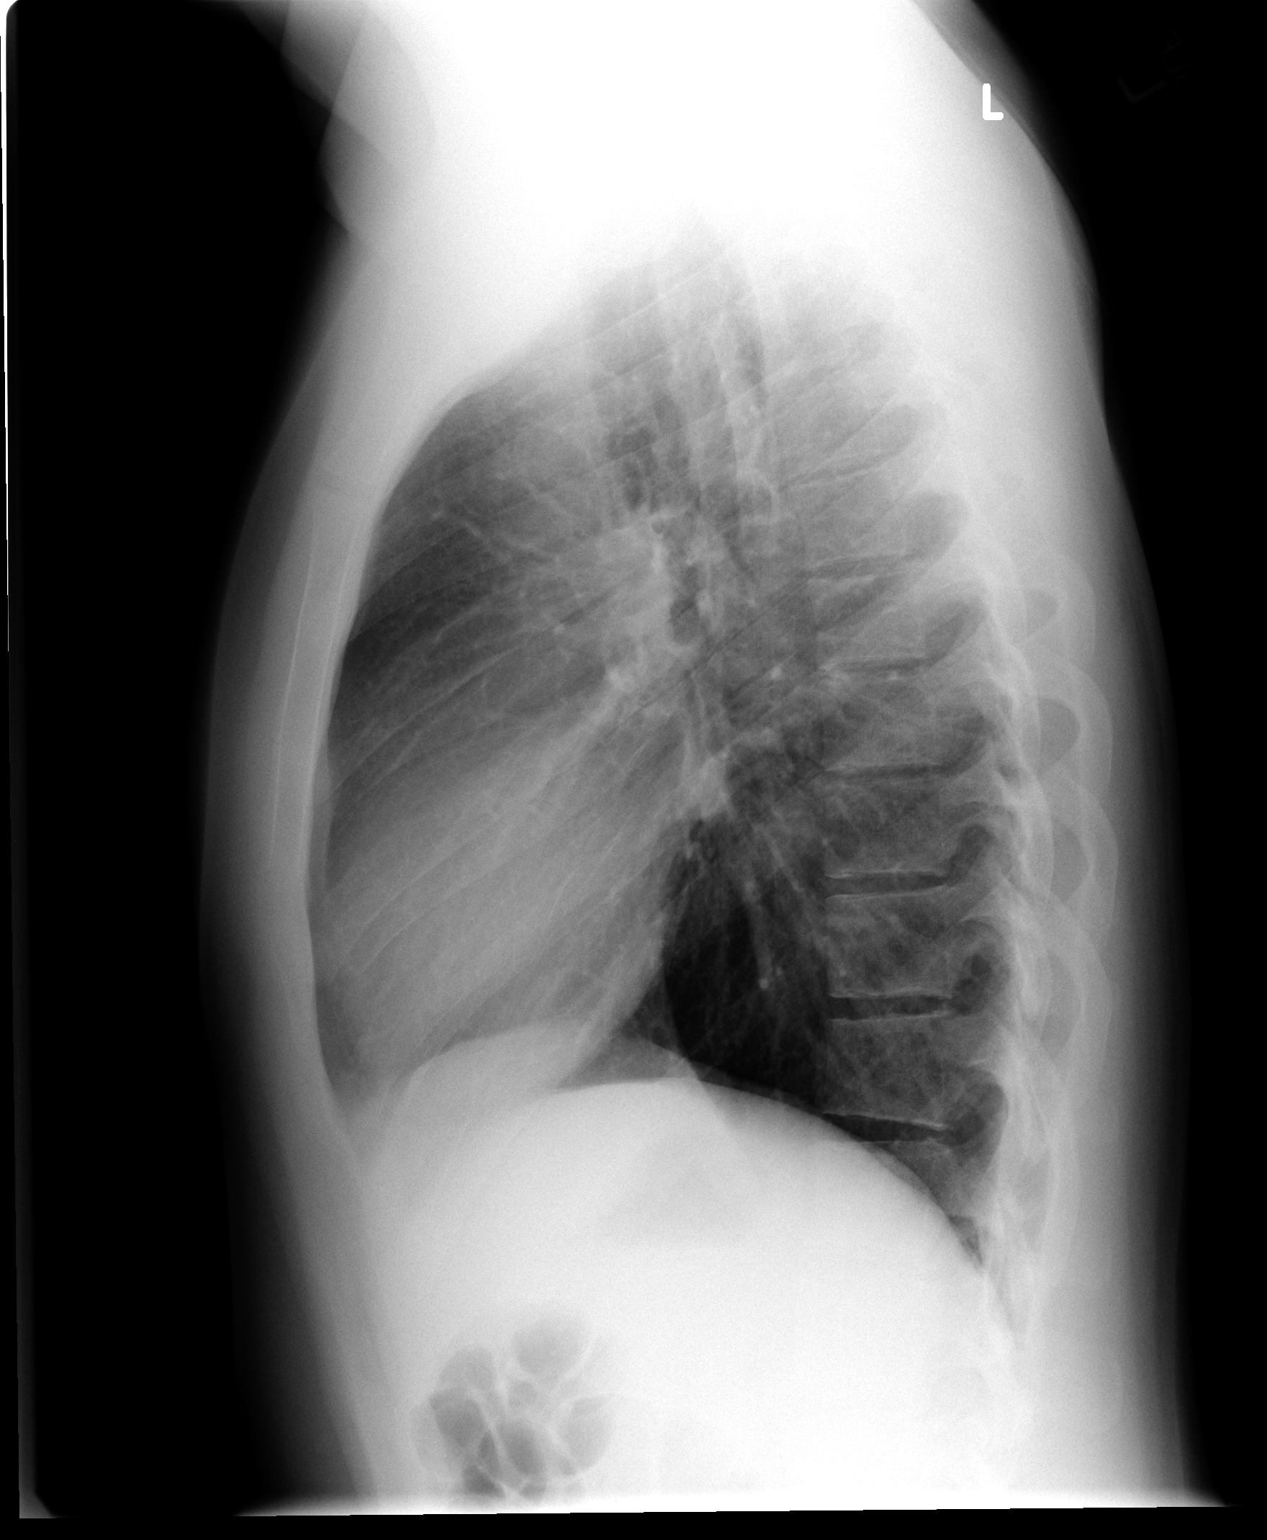

[2 of 2 positions shown; findings below may reference images not displayed]

FINDINGS: There is no focal infiltrate, pulmonary edema, or pleural
effusion.  The mediastinal contour and cardiac silhouette are
normal.  There is scoliosis of spine.
IMPRESSION: No acute cardiopulmonary disease identified.  No

## 2019-03-07 ENCOUNTER — Encounter (HOSPITAL_COMMUNITY): Payer: Self-pay

## 2019-03-07 ENCOUNTER — Ambulatory Visit (HOSPITAL_COMMUNITY)
Admission: EM | Admit: 2019-03-07 | Discharge: 2019-03-07 | Disposition: A | Discharge status: Home / Self Care | Payer: PRIVATE HEALTH INSURANCE | Attending: Family Medicine | Admitting: Family Medicine

## 2019-03-07 ENCOUNTER — Other Ambulatory Visit: Payer: Self-pay

## 2019-03-07 DIAGNOSIS — K047 Periapical abscess without sinus: Secondary | ICD-10-CM | POA: Diagnosis not present

## 2019-03-07 DIAGNOSIS — K029 Dental caries, unspecified: Secondary | ICD-10-CM | POA: Diagnosis not present

## 2019-03-07 DIAGNOSIS — K0889 Other specified disorders of teeth and supporting structures: Secondary | ICD-10-CM

## 2019-03-07 MED ORDER — PENICILLIN V POTASSIUM 500 MG PO TABS: 500.0000 mg | ORAL_TABLET | Freq: Four times a day (QID) | ORAL | 0 refills | Status: AC

## 2019-03-07 MED ORDER — IBUPROFEN 800 MG PO TABS: 800.0000 mg | ORAL_TABLET | Freq: Three times a day (TID) | ORAL | 0 refills | Status: DC

## 2019-03-07 MED ORDER — HYDROCODONE-ACETAMINOPHEN 5-325 MG PO TABS: 1.0000 | ORAL_TABLET | ORAL | 0 refills | Status: DC | PRN

## 2019-03-07 MED ORDER — HYDROCODONE-ACETAMINOPHEN 5-325 MG PO TABS
1.0000 | ORAL_TABLET | ORAL | 0 refills | Status: DC | PRN
Start: 2019-03-07 — End: 2020-04-16

## 2019-03-07 NOTE — ED Triage Notes (Signed)
Pt states he has some bad teeth ( the teeth broke off ) pt states he's having shooting pain on the right side of his mouth.

## 2019-03-07 NOTE — ED Provider Notes (Signed)
[facility]    CSN: [id number] Arrival date & time: 03/07/19  3154      History   Chief Complaint Chief Complaint  Patient presents with  . Dental Pain    HPI Jake Mcdonald is a 28 y.o. male.   HPI  Patient states he has "bad teeth" he has multiple fractures and caries.  He states he is unable to afford dental care.  He states he has been having problems with the right lower jaw.  The gums are red and swollen.  Bleeding.  More painful.  He states he cannot eat anything without severe pain.  He has called the dentist.  He cannot be seen until next week.  Past Medical History:  Diagnosis Date  . ADHD (attention deficit hyperactivity disorder)     There are no active problems to display for this patient.   History reviewed. No pertinent surgical history.     Home Medications    Prior to Admission medications   Medication Sig Start Date End Date Taking? Authorizing Provider  HYDROcodone-acetaminophen (NORCO/VICODIN) 5-325 MG tablet Take 1-2 tablets by mouth every 4 (four) hours as needed. 03/07/19   Eustace Moore, MD  ibuprofen (ADVIL) 800 MG tablet Take 1 tablet (800 mg total) by mouth 3 (three) times daily. 03/07/19   Eustace Moore, MD  penicillin v potassium (VEETID) 500 MG tablet Take 1 tablet (500 mg total) by mouth 4 (four) times daily for 10 days. 03/07/19 03/17/19  Eustace Moore, MD    Family History History reviewed. No pertinent family history.  Social History Social History   Tobacco Use  . Smoking status: Current Every Day Smoker  . Smokeless tobacco: Never Used  Substance Use Topics  . Alcohol use: Yes  . Drug use: No     Allergies   Ritalin [methylphenidate hcl]   Review of Systems Review of Systems  Constitutional: Negative for chills and fever.  HENT: Positive for dental problem. Negative for ear pain and sore throat.   Eyes: Negative for pain and visual disturbance.  Respiratory: Negative for cough and  shortness of breath.   Cardiovascular: Negative for chest pain and palpitations.  Gastrointestinal: Negative for abdominal pain and vomiting.  Genitourinary: Negative for dysuria and hematuria.  Musculoskeletal: Negative for arthralgias and back pain.  Skin: Negative for color change and rash.  Neurological: Negative for seizures and syncope.  All other systems reviewed and are negative.    Physical Exam Triage Vital Signs ED Triage Vitals  Enc Vitals Group     BP 03/07/19 0922 113/76     Pulse Rate 03/07/19 0922 78     Resp 03/07/19 0922 18     Temp 03/07/19 0922 98.3 F (36.8 C)     Temp Source 03/07/19 0922 Oral     SpO2 03/07/19 0922 98 %     Weight 03/07/19 0919 185 lb (83.9 kg)     Height --      Head Circumference --      Peak Flow --      Pain Score 03/07/19 0919 9     Pain Loc --      Pain Edu? --      Excl. in GC? --    No data found.  Updated Vital Signs BP 113/76 (BP Location: Right Arm)   Pulse 78   Temp 98.3 F (36.8 C) (Oral)   Resp 18   Wt 83.9 kg   SpO2 98%  Visual Acuity Right Eye Distance:   Left Eye Distance:   Bilateral Distance:    Right Eye Near:   Left Eye Near:    Bilateral Near:     Physical Exam Constitutional:      General: He is not in acute distress.    Appearance: He is well-developed.  HENT:     Head: Normocephalic and atraumatic.     Nose: Nose normal.     Mouth/Throat:     Comments: Dentition in general and poor dentition.  Multiple caries.  In the right lower jaw the posterior 2 molars are broken off at the gumline with swollen gums that are friable. Eyes:     Conjunctiva/sclera: Conjunctivae normal.     Pupils: Pupils are equal, round, and reactive to light.  Neck:     Musculoskeletal: Normal range of motion.  Cardiovascular:     Rate and Rhythm: Normal rate.  Pulmonary:     Effort: Pulmonary effort is normal. No respiratory distress.  Abdominal:     General: There is no distension.     Palpations: Abdomen is  soft.  Musculoskeletal: Normal range of motion.  Skin:    General: Skin is warm and dry.  Neurological:     Mental Status: He is alert.      UC Treatments / Results  Labs (all labs ordered are listed, but only abnormal results are displayed) Labs Reviewed - No data to display  EKG   Radiology No results found.  Procedures Procedures (including critical care time)  Medications Ordered in UC Medications - No data to display  Initial Impression / Assessment and Plan / UC Course  I have reviewed the triage vital signs and the nursing notes.  Pertinent labs & imaging results that were available during my care of the patient were reviewed by me and considered in my medical decision making (see chart for details).      Final Clinical Impressions(s) / UC Diagnoses   Final diagnoses:  Dental caries  Pain, dental  Dental infection     Discharge Instructions     Take the penicillin 4 times a day Take ibuprofen 3 times a day with food.  This is for moderate pain.  This will can cause drowsiness Take hydrocodone for severe pain.  Do not drive or work while on the hydrocodone. See a dentist for follow-up   ED Prescriptions    Medication Sig Dispense Auth. Provider   penicillin v potassium (VEETID) 500 MG tablet Take 1 tablet (500 mg total) by mouth 4 (four) times daily for 10 days. 40 tablet Raylene Everts, MD   ibuprofen (ADVIL) 800 MG tablet Take 1 tablet (800 mg total) by mouth 3 (three) times daily. 21 tablet Raylene Everts, MD   HYDROcodone-acetaminophen (NORCO/VICODIN) 5-325 MG tablet Take 1-2 tablets by mouth every 6 (four) hours as needed. 10 tablet Raylene Everts, MD     I have reviewed the PDMP during this encounter.   Raylene Everts, MD 03/07/19 1028

## 2019-03-07 NOTE — Discharge Instructions (Signed)
Take the penicillin 4 times a day Take ibuprofen 3 times a day with food.  This is for moderate pain.  This will can cause drowsiness Take hydrocodone for severe pain.  Do not drive or work while on the hydrocodone. See a dentist for follow-up

## 2020-04-01 ENCOUNTER — Other Ambulatory Visit: Payer: Self-pay

## 2020-04-01 ENCOUNTER — Ambulatory Visit (HOSPITAL_COMMUNITY)
Admission: EM | Admit: 2020-04-01 | Discharge: 2020-04-01 | Disposition: A | Discharge status: Home / Self Care | Payer: PRIVATE HEALTH INSURANCE

## 2020-04-01 NOTE — ED Notes (Addendum)
Pt called for triage in lobby and called on phone no answer.

## 2020-04-16 ENCOUNTER — Other Ambulatory Visit: Payer: Self-pay

## 2020-04-16 ENCOUNTER — Encounter (HOSPITAL_COMMUNITY): Payer: Self-pay | Admitting: Emergency Medicine

## 2020-04-16 ENCOUNTER — Ambulatory Visit (HOSPITAL_COMMUNITY)
Admission: EM | Admit: 2020-04-16 | Discharge: 2020-04-16 | Disposition: A | Discharge status: Home / Self Care | Payer: PRIVATE HEALTH INSURANCE | Attending: Family Medicine | Admitting: Family Medicine

## 2020-04-16 DIAGNOSIS — K029 Dental caries, unspecified: Secondary | ICD-10-CM

## 2020-04-16 MED ORDER — IBUPROFEN 800 MG PO TABS: 800.0000 mg | ORAL_TABLET | Freq: Three times a day (TID) | ORAL | 0 refills | Status: DC

## 2020-04-16 MED ORDER — AMOXICILLIN 500 MG PO CAPS
1000.0000 mg | ORAL_CAPSULE | Freq: Two times a day (BID) | ORAL | 0 refills | Status: DC
Start: 2020-04-16 — End: 2024-01-13

## 2020-04-16 NOTE — ED Provider Notes (Signed)
[facility]    CSN: [id number] Arrival date & time: 04/16/20  6295      History   Chief Complaint Chief Complaint  Patient presents with  . Dental Pain    HPI ADAIR LAUDERBACK is a 29 y.o. male.   Patient is a 29 year old male who presents today with dental pain.  Has multiple dental caries in mouth and needs to have multiple extractions.  This is a chronic recurring problem for him.  He has had mostly pain to the left side and some mild swelling to the top of his mouth.  No fevers, chills, trismus     Past Medical History:  Diagnosis Date  . ADHD (attention deficit hyperactivity disorder)     There are no problems to display for this patient.   History reviewed. No pertinent surgical history.     Home Medications    Prior to Admission medications   Medication Sig Start Date End Date Taking? Authorizing Provider  amoxicillin (AMOXIL) 500 MG capsule Take 2 capsules (1,000 mg total) by mouth 2 (two) times daily. 04/16/20   Dahlia Byes A, NP  ibuprofen (ADVIL) 800 MG tablet Take 1 tablet (800 mg total) by mouth 3 (three) times daily. 04/16/20   Janace Aris, NP    Family History History reviewed. No pertinent family history.  Social History Social History   Tobacco Use  . Smoking status: Current Every Day Smoker  . Smokeless tobacco: Never Used  Substance Use Topics  . Alcohol use: Yes  . Drug use: No     Allergies   Ritalin [methylphenidate hcl]   Review of Systems Review of Systems   Physical Exam Triage Vital Signs ED Triage Vitals  Enc Vitals Group     BP 04/16/20 0838 (!) 149/81     Pulse Rate 04/16/20 0838 63     Resp 04/16/20 0838 17     Temp 04/16/20 0838 98.5 F (36.9 C)     Temp Source 04/16/20 0838 Oral     SpO2 04/16/20 0838 98 %     Weight --      Height --      Head Circumference --      Peak Flow --      Pain Score 04/16/20 0835 4     Pain Loc --      Pain Edu? --      Excl. in GC? --    No data  found.  Updated Vital Signs BP (!) 149/81 (BP Location: Left Arm)   Pulse 63   Temp 98.5 F (36.9 C) (Oral)   Resp 17   SpO2 98%   Visual Acuity Right Eye Distance:   Left Eye Distance:   Bilateral Distance:    Right Eye Near:   Left Eye Near:    Bilateral Near:     Physical Exam Vitals and nursing note reviewed.  Constitutional:      Appearance: Normal appearance.  HENT:     Head: Normocephalic and atraumatic.     Nose: Nose normal.     Mouth/Throat:     Dentition: Dental tenderness, gingival swelling and dental caries present.  Eyes:     Conjunctiva/sclera: Conjunctivae normal.  Pulmonary:     Effort: Pulmonary effort is normal.  Musculoskeletal:        General: Normal range of motion.     Cervical back: Normal range of motion.  Skin:    General: Skin is warm and dry.  Neurological:  Mental Status: He is alert.  Psychiatric:        Mood and Affect: Mood normal.      UC Treatments / Results  Labs (all labs ordered are listed, but only abnormal results are displayed) Labs Reviewed - No data to display  EKG   Radiology No results found.  Procedures Procedures (including critical care time)  Medications Ordered in UC Medications - No data to display  Initial Impression / Assessment and Plan / UC Course  I have reviewed the triage vital signs and the nursing notes.  Pertinent labs & imaging results that were available during my care of the patient were reviewed by me and considered in my medical decision making (see chart for details).     Dental caries This is a chronic issue that flares up from time to time.  Treated with amoxicillin.  Ibuprofen for pain as needed. Follow up as needed for continued or worsening symptoms  Final Clinical Impressions(s) / UC Diagnoses   Final diagnoses:  Dental caries     Discharge Instructions     Medication as  prescribed.  Follow-up with the  dentist as planned.     ED Prescriptions     Medication Sig Dispense Auth. Provider   ibuprofen (ADVIL) 800 MG tablet Take 1 tablet (800 mg total) by mouth 3 (three) times daily. 21 tablet Lachrisha Ziebarth A, NP   amoxicillin (AMOXIL) 500 MG capsule Take 2 capsules (1,000 mg total) by mouth 2 (two) times daily. 40 capsule Luiza Carranco A, NP     PDMP not reviewed this encounter.   Janace Aris, NP 04/16/20 1221

## 2020-04-16 NOTE — Discharge Instructions (Addendum)
Medication as  prescribed.  Follow-up with the  dentist as planned.

## 2020-04-16 NOTE — ED Triage Notes (Signed)
Pt presents with left sided tooth/ gum pain. States has had dental trouble for years now.

## 2020-12-04 ENCOUNTER — Other Ambulatory Visit: Payer: Self-pay

## 2020-12-04 ENCOUNTER — Emergency Department
Admission: EM | Admit: 2020-12-04 | Discharge: 2020-12-04 | Disposition: A | Discharge status: Home / Self Care | Payer: Medicaid Other | Attending: Emergency Medicine | Admitting: Emergency Medicine

## 2020-12-04 DIAGNOSIS — Z87442 Personal history of urinary calculi: Secondary | ICD-10-CM | POA: Insufficient documentation

## 2020-12-04 DIAGNOSIS — Z5321 Procedure and treatment not carried out due to patient leaving prior to being seen by health care provider: Secondary | ICD-10-CM | POA: Insufficient documentation

## 2020-12-04 DIAGNOSIS — R112 Nausea with vomiting, unspecified: Secondary | ICD-10-CM | POA: Insufficient documentation

## 2020-12-04 DIAGNOSIS — R109 Unspecified abdominal pain: Secondary | ICD-10-CM | POA: Insufficient documentation

## 2020-12-04 LAB — URINALYSIS, COMPLETE (UACMP) WITH MICROSCOPIC
Bacteria, UA: NONE SEEN
Bilirubin Urine: NEGATIVE
Glucose, UA: NEGATIVE mg/dL
Ketones, ur: NEGATIVE mg/dL
Leukocytes,Ua: NEGATIVE
Nitrite: NEGATIVE
Protein, ur: NEGATIVE mg/dL
Specific Gravity, Urine: 1.026 (ref 1.005–1.030)
Squamous Epithelial / LPF: NONE SEEN (ref 0–5)
pH: 5 (ref 5.0–8.0)

## 2020-12-04 LAB — BASIC METABOLIC PANEL
Anion gap: 10 (ref 5–15)
BUN: 12 mg/dL (ref 6–20)
CO2: 24 mmol/L (ref 22–32)
Calcium: 9.4 mg/dL (ref 8.9–10.3)
Chloride: 105 mmol/L (ref 98–111)
Creatinine, Ser: 1.1 mg/dL (ref 0.61–1.24)
GFR, Estimated: >60 mL/min (ref >60)
Glucose, Bld: 119 mg/dL — ABNORMAL HIGH (ref 70–99)
Potassium: 3.2 mmol/L — ABNORMAL LOW (ref 3.5–5.1)
Sodium: 139 mmol/L (ref 135–145)

## 2020-12-04 LAB — CBC
HCT: 43.8 % (ref 39.0–52.0)
Hemoglobin: 14.9 g/dL (ref 13.0–17.0)
MCH: 27.4 pg (ref 26.0–34.0)
MCHC: 34 g/dL (ref 30.0–36.0)
MCV: 80.7 fL (ref 80.0–100.0)
Platelets: 253 10*3/uL (ref 150–400)
RBC: 5.43 MIL/uL (ref 4.22–5.81)
RDW: 12.5 % (ref 11.5–15.5)
WBC: 15.7 10*3/uL — ABNORMAL HIGH (ref 4.0–10.5)
nRBC: 0 % (ref 0.0–0.2)

## 2020-12-04 MED ORDER — KETOROLAC TROMETHAMINE 30 MG/ML IJ SOLN: 30.0000 mg | Freq: Once | INTRAMUSCULAR | Status: DC

## 2020-12-04 MED ORDER — LACTATED RINGERS IV BOLUS: 1000.0000 mL | Freq: Once | INTRAVENOUS | Status: DC

## 2020-12-04 MED ORDER — ONDANSETRON HCL 4 MG/2ML IJ SOLN: 4.0000 mg | Freq: Once | INTRAMUSCULAR | Status: DC

## 2020-12-04 MED ORDER — HYDROMORPHONE HCL 1 MG/ML IJ SOLN: 1.0000 mg | Freq: Once | INTRAMUSCULAR | Status: DC

## 2020-12-04 NOTE — ED Triage Notes (Signed)
Pt to ED for left flank pain and abd pain that started last night with emesis and nausea.  Reports hx of kidney stone and feels like same.  Denies urinary retention

## 2020-12-04 NOTE — ED Provider Notes (Signed)
Patient left without being seen. I was not able to evaluate the pt.    Shaune Pollack, MD 12/04/20 2031

## 2020-12-04 NOTE — ED Notes (Addendum)
Pt called w/out answer at this time. RN attempted to call pt at [phone], no answer.

## 2020-12-04 NOTE — ED Notes (Signed)
Pt called second time by this rn without response

## 2024-01-13 ENCOUNTER — Other Ambulatory Visit: Payer: Self-pay

## 2024-01-13 ENCOUNTER — Ambulatory Visit (HOSPITAL_COMMUNITY): Admission: EM | Admit: 2024-01-13 | Discharge: 2024-01-13 | Disposition: A | Discharge status: Home / Self Care

## 2024-01-13 ENCOUNTER — Encounter (HOSPITAL_COMMUNITY): Payer: Self-pay | Admitting: *Deleted

## 2024-01-13 DIAGNOSIS — K029 Dental caries, unspecified: Secondary | ICD-10-CM | POA: Diagnosis not present

## 2024-01-13 DIAGNOSIS — K047 Periapical abscess without sinus: Secondary | ICD-10-CM

## 2024-01-13 MED ORDER — AMOXICILLIN-POT CLAVULANATE 875-125 MG PO TABS: 1.0000 | ORAL_TABLET | Freq: Two times a day (BID) | ORAL | 0 refills | Status: AC

## 2024-01-13 MED ORDER — DICLOFENAC SODIUM 75 MG PO TBEC: 75.0000 mg | DELAYED_RELEASE_TABLET | Freq: Two times a day (BID) | ORAL | 0 refills | Status: AC

## 2024-01-13 NOTE — ED Triage Notes (Signed)
 PT reports he has had dental pain for years . PT needs a free clinic for dental . PT is requesting ABX. Pt reports he needs to have teeth pulled.

## 2024-01-13 NOTE — ED Provider Notes (Signed)
  [facility]  Note:  This document was prepared using Dragon voice recognition software and may include unintentional dictation errors.  MRN: [id number] DOB: 14-Apr-1991  Subjective:   Jake Mcdonald is a 33 y.o. male presenting for chronic dental pain and recurrent infections for several years.  Patient reports he does not have a dentist and cannot afford tooth extractions but would like referral for dental clinic.  Patient also requesting antibiotic therapy to help with infection.  Patient has not been taking any over-the-counter medication to treat symptoms.  Patient denies fever, purulent drainage from mouth, but does have severe pain and inflammation to the left lower jaw.  No current facility-administered medications for this encounter.  Current Outpatient Medications:    amoxicillin -clavulanate (AUGMENTIN ) 875-125 MG tablet, Take 1 tablet by mouth every 12 (twelve) hours for 10 days., Disp: 20 tablet, Rfl: 0   diclofenac  (VOLTAREN ) 75 MG EC tablet, Take 1 tablet (75 mg total) by mouth 2 (two) times daily., Disp: 40 tablet, Rfl: 0   Allergies  Allergen Reactions   Ritalin [Methylphenidate Hcl] Other (See Comments)    Childhood allergy    Past Medical History:  Diagnosis Date   ADHD (attention deficit hyperactivity disorder)      History reviewed. No pertinent surgical history.  History reviewed. No pertinent family history.  Social History   Tobacco Use   Smoking status: Every Day   Smokeless tobacco: Never  Substance Use Topics   Alcohol use: Yes   Drug use: No    ROS Refer to HPI for ROS details.  Objective:   Vitals: BP (!) 129/97   Pulse 85   Temp 98.1 F (36.7 C)   Resp 18   SpO2 96%   Physical Exam Vitals and nursing note reviewed.  Constitutional:      General: He is not in acute distress.    Appearance: Normal appearance. He is well-developed. He is not ill-appearing or toxic-appearing.  HENT:     Head: Normocephalic.      Mouth/Throat:     Lips: Pink.     Mouth: Mucous membranes are moist.     Dentition: Abnormal dentition. Dental tenderness, gingival swelling, dental caries and dental abscesses present.  Cardiovascular:     Rate and Rhythm: Normal rate.  Pulmonary:     Effort: Pulmonary effort is normal. No respiratory distress.  Skin:    General: Skin is warm and dry.  Neurological:     General: No focal deficit present.     Mental Status: He is alert and oriented to person, place, and time.  Psychiatric:        Mood and Affect: Mood normal.        Behavior: Behavior normal.     Procedures  No results found for this or any previous visit (from the past 24 hours).  No results found.   Assessment and Plan :     Discharge Instructions       1. Infected dental caries (Primary) 2. Dental infection - diclofenac  (VOLTAREN ) 75 MG EC tablet; Take 1 tablet (75 mg total) by mouth 2 (two) times daily.  Dispense: 40 tablet; Refill: 0 - amoxicillin -clavulanate (AUGMENTIN ) 875-125 MG tablet; Take 1 tablet by mouth every 12 (twelve) hours for 10 days.  Dispense: 20 tablet; Refill: 0 - Affordable dental resources provided at discharge for follow-up evaluation and management.      Shizuo Biskup B [place] Daily   Shaunda Tipping, [place], [place] 01/13/24 [phone]

## 2024-01-13 NOTE — Discharge Instructions (Addendum)
  1. Infected dental caries (Primary) 2. Dental infection - diclofenac  (VOLTAREN ) 75 MG EC tablet; Take 1 tablet (75 mg total) by mouth 2 (two) times daily.  Dispense: 40 tablet; Refill: 0 - amoxicillin -clavulanate (AUGMENTIN ) 875-125 MG tablet; Take 1 tablet by mouth every 12 (twelve) hours for 10 days.  Dispense: 20 tablet; Refill: 0 - Affordable dental resources provided at discharge for follow-up evaluation and management.
